# Patient Record
Sex: Female | Born: 2005 | Race: Black or African American | Hispanic: No | Marital: Single | Smoking: Never smoker
Health system: Southern US, Community
[De-identification: ages and names within clinical notes are randomized; demographics above are authoritative.]

---
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
## 2024-05-10 ENCOUNTER — Ambulatory Visit (HOSPITAL_COMMUNITY)
Admission: EM | Admit: 2024-05-10 | Discharge: 2024-05-10 | Disposition: A | Discharge status: Home / Self Care | Attending: Emergency Medicine | Admitting: Emergency Medicine

## 2024-05-10 ENCOUNTER — Encounter (HOSPITAL_COMMUNITY): Payer: Self-pay | Admitting: *Deleted

## 2024-05-10 ENCOUNTER — Other Ambulatory Visit: Payer: Self-pay

## 2024-05-10 DIAGNOSIS — L308 Other specified dermatitis: Secondary | ICD-10-CM

## 2024-05-10 MED ORDER — METHYLPREDNISOLONE SODIUM SUCC 125 MG IJ SOLR
60.0000 mg | Freq: Once | INTRAMUSCULAR | Status: AC
  Administered 2024-05-10: 60 mg via INTRAMUSCULAR

## 2024-05-10 MED ORDER — METHYLPREDNISOLONE SODIUM SUCC 125 MG IJ SOLR
INTRAMUSCULAR | Status: AC
  Filled 2024-05-10: qty 2

## 2024-05-10 NOTE — ED Triage Notes (Signed)
 PT reports she has had hives for past 3 days. Pt took benadryl and put topical lotion on sites. Pt reports today the hives are worse. Pt has sites on bil. Legs and trunk.

## 2024-05-10 NOTE — ED Provider Notes (Signed)
 [facility]    CSN: [id number] Arrival date & time: 05/10/24  8756     History   Chief Complaint Chief Complaint  Patient presents with   Urticaria    HPI Stacey Molina is a 18 y.o. female.  Here with 3 day history of itching on the abdomen, chest, back, and legs. No rash was seen. She tried benadryl PO and anti itch lotion without change Denies shortness of breath, wheezing, swelling of lips/tongue No known exposure to allergens, although symptoms started after she was outside.  Denies new foods or medications   History reviewed. No pertinent past medical history.  There are no active problems to display for this patient.   History reviewed. No pertinent surgical history.  OB History   No obstetric history on file.      Home Medications    Prior to Admission medications   Medication Sig Start Date End Date Taking? Authorizing Provider  polyethylene glycol (MIRALAX ) 17 g packet Take 17 g by mouth daily as needed for mild constipation or moderate constipation. 04/21/21   Long, Shereen Dike, MD    Family History Family History  Problem Relation Age of Onset   Healthy Mother    Healthy Father    Diabetes Paternal Grandfather    Hypertension Paternal Aunt    Cancer Maternal Grandfather    Cancer Maternal Grandmother     Social History Social History   Tobacco Use   Smoking status: Never   Smokeless tobacco: Never  Vaping Use   Vaping status: Never Used  Substance Use Topics   Alcohol use: Never   Drug use: Never     Allergies   Patient has no known allergies.   Review of Systems Review of Systems Per HPI  Physical Exam Triage Vital Signs ED Triage Vitals [05/10/24 0926]  Encounter Vitals Group     BP      Systolic BP Percentile      Diastolic BP Percentile      Pulse      Resp      Temp      Temp src      SpO2      Weight      Height      Head Circumference      Peak Flow      Pain Score 0     Pain Loc      Pain Education       Exclude from Growth Chart    No data found.  Updated Vital Signs BP 109/78   Pulse 90   Temp 98.8 F (37.1 C)   Resp 18   LMP 04/20/2024 (Exact Date)   SpO2 99%   Physical Exam Vitals and nursing note reviewed.  Constitutional:      General: She is not in acute distress.    Appearance: Normal appearance.  HENT:     Mouth/Throat:     Mouth: Mucous membranes are moist.     Pharynx: Oropharynx is clear. No posterior oropharyngeal erythema.  Eyes:     Conjunctiva/sclera: Conjunctivae normal.     Pupils: Pupils are equal, round, and reactive to light.  Cardiovascular:     Rate and Rhythm: Normal rate and regular rhythm.     Pulses: Normal pulses.     Heart sounds: Normal heart sounds.  Pulmonary:     Effort: Pulmonary effort is normal. No respiratory distress.     Breath sounds: Normal breath sounds. No wheezing.  Abdominal:  General: Bowel sounds are normal.     Tenderness: There is no abdominal tenderness.  Musculoskeletal:        General: Normal range of motion.     Cervical back: Normal range of motion.  Skin:    Findings: No rash.     Comments: No rash is visualized   Neurological:     Mental Status: She is alert and oriented to person, place, and time.     UC Treatments / Results  Labs (all labs ordered are listed, but only abnormal results are displayed) Labs Reviewed - No data to display  EKG  Radiology No results found.  Procedures Procedures   Medications Ordered in UC Medications  methylPREDNISolone  sodium succinate (SOLU-MEDROL ) 125 mg/2 mL injection 60 mg (has no administration in time range)    Initial Impression / Assessment and Plan / UC Course  I have reviewed the triage vital signs and the nursing notes.  Pertinent labs & imaging results that were available during my care of the patient were reviewed by me and considered in my medical decision making (see chart for details).  Afebrile, well appearing, clear lungs. No red  flags No rash on exam. Pruritic condition  IM steroid in clinic for acute itch relief  Recommend antihistamine regimen at home Discussed return precautions.  Patient agrees to plan, no questions  Final Clinical Impressions(s) / UC Diagnoses   Final diagnoses:  Pruritic dermatitis     Discharge Instructions      The steroid injection can start to work in about 30 minutes to reduce itching  At home please use the following antihistamine regimen: Daily loratadine 10 mg Nighty pepcid 20 mg and benadryl 25 mg Use for the next 7 days in a row to keep itch away   ED Prescriptions   None    PDMP not reviewed this encounter.   Mallory Schaad, Ivette Marks, [place] 05/10/24 1010

## 2024-05-10 NOTE — Discharge Instructions (Addendum)
 The steroid injection can start to work in about 30 minutes to reduce itching  At home please use the following antihistamine regimen: Daily loratadine 10 mg Nighty pepcid 20 mg and benadryl 25 mg Use for the next 7 days in a row to keep itch away
